# Patient Record
Sex: Male | Born: 1995 | Race: Black or African American | Hispanic: No | Marital: Single | Smoking: Current some day smoker
Health system: Southern US, Community
[De-identification: ages and names within clinical notes are randomized; demographics above are authoritative.]

## PROBLEM LIST (undated history)

## (undated) HISTORY — PX: WISDOM TOOTH EXTRACTION: SHX21

---
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
## 2014-05-30 ENCOUNTER — Emergency Department: Payer: Self-pay | Admitting: Internal Medicine

## 2015-12-27 ENCOUNTER — Encounter (HOSPITAL_COMMUNITY): Payer: Self-pay | Admitting: Emergency Medicine

## 2015-12-27 ENCOUNTER — Emergency Department (HOSPITAL_COMMUNITY)
Admission: EM | Admit: 2015-12-27 | Discharge: 2015-12-27 | Disposition: A | Discharge status: Home / Self Care | Payer: Self-pay | Attending: Emergency Medicine | Admitting: Emergency Medicine

## 2015-12-27 DIAGNOSIS — Z202 Contact with and (suspected) exposure to infections with a predominantly sexual mode of transmission: Secondary | ICD-10-CM

## 2015-12-27 DIAGNOSIS — R300 Dysuria: Secondary | ICD-10-CM | POA: Insufficient documentation

## 2015-12-27 DIAGNOSIS — R369 Urethral discharge, unspecified: Secondary | ICD-10-CM

## 2015-12-27 LAB — URINE MICROSCOPIC-ADD ON
RBC / HPF: NONE SEEN RBC/hpf (ref 0–5)
Squamous Epithelial / HPF: NONE SEEN

## 2015-12-27 LAB — URINALYSIS, ROUTINE W REFLEX MICROSCOPIC
Bilirubin Urine: NEGATIVE
Glucose, UA: NEGATIVE mg/dL
Hgb urine dipstick: NEGATIVE
Ketones, ur: NEGATIVE mg/dL
Nitrite: NEGATIVE
Protein, ur: NEGATIVE mg/dL
Specific Gravity, Urine: 1.025 (ref 1.005–1.030)
pH: 6.5 (ref 5.0–8.0)

## 2015-12-27 MED ORDER — AZITHROMYCIN 250 MG PO TABS
1000.0000 mg | ORAL_TABLET | Freq: Once | ORAL | Status: AC
  Administered 2015-12-27: 1000 mg via ORAL
  Filled 2015-12-27: qty 4

## 2015-12-27 MED ORDER — STERILE WATER FOR INJECTION IJ SOLN
INTRAMUSCULAR | Status: AC
  Administered 2015-12-27: 0.9 mL
  Filled 2015-12-27: qty 10

## 2015-12-27 MED ORDER — CEFTRIAXONE SODIUM 250 MG IJ SOLR
250.0000 mg | Freq: Once | INTRAMUSCULAR | Status: AC
  Administered 2015-12-27: 250 mg via INTRAMUSCULAR
  Filled 2015-12-27: qty 250

## 2015-12-27 NOTE — ED Notes (Signed)
Pt requests STD check and sts penile discharge and burning with urination

## 2015-12-27 NOTE — ED Provider Notes (Signed)
CSN: [id number]     Arrival date & time 12/27/15  1330 History  By signing my name below, I, Ronney LionSuzanne Le, attest that this documentation has been prepared under the direction and in the presence of Ingram Onnen, PA-C. Electronically Signed: Ronney LionSuzanne Le, ED Scribe. 12/27/2015. 3:55 PM.    Chief Complaint  Patient presents with  . Exposure to STD  . Penile Discharge   The history is provided by the patient. No language interpreter was used.   HPI Comments: Vanessa RalphsMonte Shipp is a 20 y.o. male who presents to the Emergency Department complaining of constant, moderate, white penile discharge and burning dysuria that began 1-2 weeks ago. Patient states that at the time, he was sexually active with multiple partners. He reports a history of STD's in the past. No prior treatments or modifying factors were noted. Patient denies abdominal pain, back pain, testicular pain, fever, chills, vomiting, penile itching, or rashes.   History reviewed. No pertinent past medical history. History reviewed. No pertinent past surgical history. History reviewed. No pertinent family history. Social History  Substance Use Topics  . Smoking status: Never Smoker   . Smokeless tobacco: None  . Alcohol Use: Yes    Review of Systems  Constitutional: Negative for fever and chills.  Gastrointestinal: Negative for vomiting and abdominal pain.  Genitourinary: Positive for dysuria and discharge. Negative for testicular pain.  Musculoskeletal: Negative for back pain.  Skin: Negative for rash.   Allergies  Review of patient's allergies indicates no known allergies.  Home Medications   Prior to Admission medications   Not on File   BP 115/73 mmHg  Pulse 59  Temp(Src) 97.9 F (36.6 C) (Oral)  Resp 18  SpO2 97% Physical Exam  Constitutional: He is oriented to person, place, and time. He appears well-developed and well-nourished. No distress.  HENT:  Head: Normocephalic and atraumatic.  Eyes: Conjunctivae are normal.  Right eye exhibits no discharge. Left eye exhibits no discharge. No scleral icterus.  Cardiovascular: Normal rate.   Pulmonary/Chest: Effort normal.  Genitourinary:  White penile discharge present. No penile or testicular tenderness. No rashes noted.   Neurological: He is alert and oriented to person, place, and time. Coordination normal.  Skin: Skin is warm and dry. No rash noted. He is not diaphoretic. No erythema. No pallor.  Psychiatric: He has a normal mood and affect. His behavior is normal.  Nursing note and vitals reviewed.   ED Course  Procedures (including critical care time)  DIAGNOSTIC STUDIES: Oxygen Saturation is 97% on RA, normal by my interpretation.    COORDINATION OF CARE: 3:07 PM - Discussed treatment plan with pt at bedside which includes STD testing and prophylactic treatment. Pt verbalized understanding and agreed to plan.   MDM   Final diagnoses:  Penile discharge  STD exposure   Patient treated in the ED for STI prophylactically with Rocephin and azithromycin. Patient advised to inform and treat all sexual partners.  Pt advised on safe sex practices and understands that they have GC/Chlamydia cultures pending and will result in 2-3 days. HIV and RPR sent. Advised abstention for 1-2 weeks and discussion of any positive STD test results with all partners. Pt encouraged to follow up at local health department for future STI checks. No concern for prostatitis or epididymitis. Discussed return precautions. Pt appears safe for discharge.   I personally performed the services described in this documentation, which was scribed in my presence. The recorded information has been reviewed and is accurate.  Lester Kinsman [place], PA-C 12/29/15 1024  Richardean Canal, MD 12/29/15 [phone]

## 2015-12-27 NOTE — Discharge Instructions (Signed)
Sexually Transmitted Disease °A sexually transmitted disease (STD) is a disease or infection that may be passed (transmitted) from person to person, usually during sexual activity. This may happen by way of saliva, semen, blood, vaginal mucus, or urine. Common STDs include: °· Gonorrhea. °· Chlamydia. °· Syphilis. °· HIV and AIDS. °· Genital herpes. °· Hepatitis B and C. °· Trichomonas. °· Human papillomavirus (HPV). °· Pubic lice. °· Scabies. °· Mites. °· Bacterial vaginosis. °WHAT ARE CAUSES OF STDs? °An STD may be caused by bacteria, a virus, or parasites. STDs are often transmitted during sexual activity if one person is infected. However, they may also be transmitted through nonsexual means. STDs may be transmitted after:  °· Sexual intercourse with an infected person. °· Sharing sex toys with an infected person. °· Sharing needles with an infected person or using unclean piercing or tattoo needles. °· Having intimate contact with the genitals, mouth, or rectal areas of an infected person. °· Exposure to infected fluids during birth. °WHAT ARE THE SIGNS AND SYMPTOMS OF STDs? °Different STDs have different symptoms. Some people may not have any symptoms. If symptoms are present, they may include: °· Painful or bloody urination. °· Pain in the pelvis, abdomen, vagina, anus, throat, or eyes. °· A skin rash, itching, or irritation. °· Growths, ulcerations, blisters, or sores in the genital and anal areas. °· Abnormal vaginal discharge with or without bad odor. °· Penile discharge in men. °· Fever. °· Pain or bleeding during sexual intercourse. °· Swollen glands in the groin area. °· Yellow skin and eyes (jaundice). This is seen with hepatitis. °· Swollen testicles. °· Infertility. °· Sores and blisters in the mouth. °HOW ARE STDs DIAGNOSED? °To make a diagnosis, your health care provider may: °· Take a medical history. °· Perform a physical exam. °· Take a sample of any discharge to examine. °· Swab the throat,  cervix, opening to the penis, rectum, or vagina for testing. °· Test a sample of your first morning urine. °· Perform blood tests. °· Perform a Pap test, if this applies. °· Perform a colposcopy. °· Perform a laparoscopy. °HOW ARE STDs TREATED? °Treatment depends on the STD. Some STDs may be treated but not cured. °· Chlamydia, gonorrhea, trichomonas, and syphilis can be cured with antibiotic medicine. °· Genital herpes, hepatitis, and HIV can be treated, but not cured, with prescribed medicines. The medicines lessen symptoms. °· Genital warts from HPV can be treated with medicine or by freezing, burning (electrocautery), or surgery. Warts may come back. °· HPV cannot be cured with medicine or surgery. However, abnormal areas may be removed from the cervix, vagina, or vulva. °· If your diagnosis is confirmed, your recent sexual partners need treatment. This is true even if they are symptom-free or have a negative culture or evaluation. They should not have sex until their health care providers say it is okay. °· Your health care provider may test you for infection again 3 months after treatment. °HOW CAN I REDUCE MY RISK OF GETTING AN STD? °Take these steps to reduce your risk of getting an STD: °· Use latex condoms, dental dams, and water-soluble lubricants during sexual activity. Do not use petroleum jelly or oils. °· Avoid having multiple sex partners. °· Do not have sex with someone who has other sex partners °· Do not have sex with anyone you do not know or who is at high risk for an STD. °· Avoid risky sex practices that can break your skin. °· Do not have sex   if you have open sores on your mouth or skin.  Avoid drinking too much alcohol or taking illegal drugs. Alcohol and drugs can affect your judgment and put you in a vulnerable position.  Avoid engaging in oral and anal sex acts.  Get vaccinated for HPV and hepatitis. If you have not received these vaccines in the past, talk to your health care  provider about whether one or both might be right for you.  If you are at risk of being infected with HIV, it is recommended that you take a prescription medicine daily to prevent HIV infection. This is called pre-exposure prophylaxis (PrEP). You are considered at risk if:  You are a man who has sex with other men (MSM).  You are a heterosexual man or woman and are sexually active with more than one partner.  You take drugs by injection.  You are sexually active with a partner who has HIV.  Talk with your health care provider about whether you are at high risk of being infected with HIV. If you choose to begin PrEP, you should first be tested for HIV. You should then be tested every 3 months for as long as you are taking PrEP. WHAT SHOULD I DO IF I THINK I HAVE AN STD?  See your health care provider.  Tell your sexual partner(s). They should be tested and treated for any STDs.  Do not have sex until your health care provider says it is okay. WHEN SHOULD I GET IMMEDIATE MEDICAL CARE? Contact your health care provider right away if:   You have severe abdominal pain.  You are a man and notice swelling or pain in your testicles.  You are a woman and notice swelling or pain in your vagina.   This information is not intended to replace advice given to you by your health care provider. Make sure you discuss any questions you have with your health care provider.   [facility] for symptoms don't improve. You'll be notified if your urine is infected. Avoid sexual intercourse until your gonorrhea and chlamydia cultures return. If your cultures come back positive you'll need to notify all sexual partners that they may be informed and treated as well. Return to the emergency department if you experience severe worsening of your symptoms, abdominal pain, blood in your urine, fever, chills.

## 2015-12-28 LAB — GC/CHLAMYDIA PROBE AMP (~~LOC~~) NOT AT ARMC
Chlamydia: NEGATIVE
Neisseria Gonorrhea: POSITIVE — AB

## 2015-12-28 LAB — HIV ANTIBODY (ROUTINE TESTING W REFLEX): HIV Screen 4th Generation wRfx: NONREACTIVE

## 2015-12-28 LAB — SYPHILIS: RPR W/REFLEX TO RPR TITER AND TREPONEMAL ANTIBODIES, TRADITIONAL SCREENING AND DIAGNOSIS ALGORITHM: RPR Ser Ql: NONREACTIVE

## 2015-12-29 ENCOUNTER — Telehealth (HOSPITAL_BASED_OUTPATIENT_CLINIC_OR_DEPARTMENT_OTHER): Payer: Self-pay | Admitting: Emergency Medicine

## 2015-12-29 LAB — URINE CULTURE
Culture: NO GROWTH
Special Requests: NORMAL

## 2016-01-19 ENCOUNTER — Telehealth: Payer: Self-pay | Admitting: Emergency Medicine

## 2016-01-19 NOTE — Telephone Encounter (Signed)
Patient lost to followup, no response from letter 

## 2017-06-14 ENCOUNTER — Emergency Department (HOSPITAL_COMMUNITY): Payer: Worker's Compensation

## 2017-06-14 ENCOUNTER — Emergency Department (HOSPITAL_COMMUNITY)
Admission: EM | Admit: 2017-06-14 | Discharge: 2017-06-14 | Disposition: A | Discharge status: Home / Self Care | Payer: Worker's Compensation | Attending: Emergency Medicine | Admitting: Emergency Medicine

## 2017-06-14 ENCOUNTER — Encounter (HOSPITAL_COMMUNITY): Payer: Self-pay | Admitting: Emergency Medicine

## 2017-06-14 DIAGNOSIS — Y929 Unspecified place or not applicable: Secondary | ICD-10-CM | POA: Insufficient documentation

## 2017-06-14 DIAGNOSIS — Y939 Activity, unspecified: Secondary | ICD-10-CM | POA: Insufficient documentation

## 2017-06-14 DIAGNOSIS — S99921A Unspecified injury of right foot, initial encounter: Secondary | ICD-10-CM | POA: Diagnosis not present

## 2017-06-14 DIAGNOSIS — W208XXA Other cause of strike by thrown, projected or falling object, initial encounter: Secondary | ICD-10-CM | POA: Insufficient documentation

## 2017-06-14 DIAGNOSIS — F17290 Nicotine dependence, other tobacco product, uncomplicated: Secondary | ICD-10-CM | POA: Diagnosis not present

## 2017-06-14 DIAGNOSIS — Y990 Civilian activity done for income or pay: Secondary | ICD-10-CM | POA: Insufficient documentation

## 2017-06-14 MED ORDER — IBUPROFEN 600 MG PO TABS: 600.0000 mg | ORAL_TABLET | Freq: Four times a day (QID) | ORAL | 0 refills | Status: AC | PRN

## 2017-06-14 NOTE — ED Provider Notes (Signed)
[facility]-EMERGENCY DEPT Provider Note   CSN: [id number] Arrival date & time: 06/14/17  1023     History   Chief Complaint Chief Complaint  Patient presents with  . Foot Injury    HPI Antonio Austin is a 21 y.o. male.  HPI   Patient is a 21 year old male with no significant past medical history presenting after an injury on 06-13-2017 where he dropped a board on his right foot at work. Patient reports that he was weight bearing after the injury and finished at the remainder of the day at work. Patient reports some ecchymosis and swelling over the dorsum of the foot, as well as some paresthesias, however denies anesthesia of the right foot, pallor, increasing pain. Patient denies any precipitator to this event such as dizziness, lightheadedness, or presyncope. Patient has not tried anything for symptoms.  History reviewed. No pertinent past medical history.  There are no active problems to display for this patient.   Past Surgical History:  Procedure Laterality Date  . WISDOM TOOTH EXTRACTION         Home Medications    Prior to Admission medications   Not on File    Family History No family history on file.  Social History Social History  Substance Use Topics  . Smoking status: Current Every Day Smoker    Types: Cigars  . Smokeless tobacco: Never Used  . Alcohol use Yes     Allergies   Bee venom   Review of Systems Review of Systems  Musculoskeletal: Positive for joint swelling.  Skin: Positive for color change.  Neurological: Negative for dizziness, light-headedness and numbness.     Physical Exam Updated Vital Signs BP 106/65 (BP Location: Left Arm)   Pulse 68   Temp 97.9 F (36.6 C) (Oral)   Resp 16   SpO2 99%   Physical Exam  Constitutional: He appears well-developed and well-nourished. No distress.  Sitting comfortably in bed.  HENT:  Head: Normocephalic and atraumatic.  Eyes: Conjunctivae are normal. Right eye exhibits no discharge. Left eye  exhibits no discharge.  EOMs normal to gross examination.  Neck: Normal range of motion.  Cardiovascular: Normal rate and regular rhythm.   Pulses:      Dorsalis pedis pulses are 2+ on the right side.  Intact, 2+ radial pulse.  Pulmonary/Chest:  Normal respiratory effort. Patient converses comfortably. No audible wheeze or stridor.  Abdominal: He exhibits no distension.  Musculoskeletal: Normal range of motion.  Right foot demonstrates erythema and ecchymosis over the dorsum of the foot. Tenderness over dorsum of foot. No ligamentous instability of the ankle mortise or forefoot. Patient presents active flexion, extension, inversion, and eversion of right foot.  Neurological: He is alert.  Cranial nerves intact to gross observation. Patient moves extremities without difficulty.  Skin: Skin is warm and dry. He is not diaphoretic.  Psychiatric: He has a normal mood and affect. His behavior is normal. Judgment and thought content normal.  Nursing note and vitals reviewed.    ED Treatments / Results  Labs (all labs ordered are listed, but only abnormal results are displayed) Labs Reviewed - No data to display  EKG  EKG Interpretation None       Radiology Dg Foot Complete Right  Result Date: 06/14/2017 CLINICAL DATA:  Dropped a board onto the dorsal surface of the RIGHT foot yesterday at work, still having pain across the metatarsals EXAM: RIGHT FOOT COMPLETE - 3+ VIEW COMPARISON:  None FINDINGS: Osseous mineralization normal. Joint spaces preserved.  No acute fracture, dislocation, or bone destruction. IMPRESSION: Normal exam. Electronically Signed   By: Ulyses Southward M.D.   On: 06/14/2017 11:17    Procedures Procedures (including critical care time)  Medications Ordered in ED Medications - No data to display   Initial Impression / Assessment and Plan / ED Course  I have reviewed the triage vital signs and the nursing notes.  Pertinent labs & imaging results that were  available during my care of the patient were reviewed by me and considered in my medical decision making (see chart for details).     Final Clinical Impressions(s) / ED Diagnoses   Final diagnoses:  None   MDM  Antonio Austin is a 21 y.o. male who presents to ED for Right foot pain after dropping a wooden board on his foot at work yesterday. RLE NVI. Exam c/w foot contusion. X-ray negative for acute injury. Postop shoe and crutches provided in ED. Home care instructions including RICE and NSAID's discussed. Follow up with  PCP and ortho if symptoms not improving in 1 week. All questions answered.     New Prescriptions New Prescriptions   No medications on file     Delia Chimes 06/14/17 1221    Derwood Kaplan, MD 06/15/17 1517

## 2017-06-14 NOTE — Discharge Instructions (Signed)
Please see the information and instructions below regarding your visit.  Your diagnoses today include:  1. Injury of right foot, initial encounter    Your provider has diagnosed you as suffering from a foot sprain. Foot sprain occurs when the ligaments that hold the foot joint together are stretched or torn. It may take a few weeks to heal.  Tests performed today include: An x-ray of your foot- does NOT show any broken bones. Please follow-up if your symptoms are not improved in 1 week however in case there was something we could not see on this visit due to swelling.  See side panel of your discharge paperwork for testing performed today. Vital signs are listed at the bottom of these instructions.   Medications prescribed:  Take any prescribed medications only as prescribed, and any over the counter medications only as directed on the packaging.  Home care instructions:  Follow R.I.C.E. Protocol: R - rest your injury  I  - use ice on injury without applying directly to skin C - compress injury with bandage or splint E - elevate the injury as much as possible  For Activity: Wear ankle brace for at least 2 weeks for stabilization of ankle. If prescribed crutches, use crutches with non-weight bearing for the first few days. Then, you may walk on your ankle as the pain allows, or as instructed. Start gradually with weight bearing on the affected ankle. Once you can walk pain free, then try jogging. When you can run forwards, then you can try moving side-to-side. If you cannot walk without crutches in one week, you need a re-check.  Please follow any educational materials contained in this packet.   Follow-up instructions: Please follow-up with your primary care provider or the provided orthopedic (bone specialist) listed in this packet if you continue to have significant pain or trouble walking in 1 week. In this case you may have a severe sprain that requires further care.   Return  instructions:  Please return if your toes are numb or tingling, appear gray or blue, are much colder than your other foot, or you have severe pain (also elevate leg and loosen splint or wrap). Please return to the Emergency Department if you experience worsening symptoms.  Please return if you have any other emergent concerns.  Additional Information:   Your vital signs today were: BP 106/65 (BP Location: Left Arm)    Pulse 68    Temp 97.9 F (36.6 C) (Oral)    Resp 16    SpO2 99%  If your blood pressure (BP) was elevated on multiple readings during this visit above 130 for the top number or above 80 for the bottom number, please have this repeated by your primary care provider within one month. --------------  Thank you for allowing [place] to participate in your care today.

## 2017-06-14 NOTE — ED Triage Notes (Signed)
Patient reports yesterday at work dropped board on right foot. Patient c/o pain worsens with movement.

## 2017-06-18 ENCOUNTER — Emergency Department (HOSPITAL_BASED_OUTPATIENT_CLINIC_OR_DEPARTMENT_OTHER)
Admission: EM | Admit: 2017-06-18 | Discharge: 2017-06-18 | Disposition: A | Discharge status: Home / Self Care | Payer: Self-pay | Attending: Emergency Medicine | Admitting: Emergency Medicine

## 2017-06-18 ENCOUNTER — Encounter (HOSPITAL_BASED_OUTPATIENT_CLINIC_OR_DEPARTMENT_OTHER): Payer: Self-pay | Admitting: Emergency Medicine

## 2017-06-18 DIAGNOSIS — R369 Urethral discharge, unspecified: Secondary | ICD-10-CM | POA: Insufficient documentation

## 2017-06-18 DIAGNOSIS — F17290 Nicotine dependence, other tobacco product, uncomplicated: Secondary | ICD-10-CM | POA: Insufficient documentation

## 2017-06-18 DIAGNOSIS — Z202 Contact with and (suspected) exposure to infections with a predominantly sexual mode of transmission: Secondary | ICD-10-CM | POA: Insufficient documentation

## 2017-06-18 LAB — URINALYSIS, ROUTINE W REFLEX MICROSCOPIC
BILIRUBIN URINE: NEGATIVE
GLUCOSE, UA: NEGATIVE mg/dL
HGB URINE DIPSTICK: NEGATIVE
KETONES UR: NEGATIVE mg/dL
LEUKOCYTES UA: NEGATIVE
Nitrite: NEGATIVE
PH: 7.5 (ref 5.0–8.0)
Protein, ur: NEGATIVE mg/dL
Specific Gravity, Urine: 1.015 (ref 1.005–1.030)

## 2017-06-18 MED ORDER — AZITHROMYCIN 250 MG PO TABS
1000.0000 mg | ORAL_TABLET | Freq: Once | ORAL | Status: AC
  Administered 2017-06-18: 1000 mg via ORAL
  Filled 2017-06-18: qty 4

## 2017-06-18 MED ORDER — CEFTRIAXONE SODIUM 250 MG IJ SOLR
250.0000 mg | Freq: Once | INTRAMUSCULAR | Status: AC
  Administered 2017-06-18: 250 mg via INTRAMUSCULAR
  Filled 2017-06-18: qty 250
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
## 2017-06-18 NOTE — Discharge Instructions (Signed)
You have been seen today in the Emergency Department (ED) for urethral discharge.  Your workup today shows that you may have been exposed to sexually transmitted infections (STIs).  You have been treated with a one time dose medication for both of these conditions.  Please have your partner tested for STD's and do not resume sexual activity until you and your partner have confirmed negative test results or have both been treated.  Please follow up with your doctor as soon as possible regarding today?s ED visit and your symptoms.   Return to the ED if your pain worsens, you develop a fever, or for any other symptoms that concern you.

## 2017-06-18 NOTE — ED Provider Notes (Signed)
Emergency Department Provider Note   I have reviewed the triage vital signs and the nursing notes.   HISTORY  Chief Complaint Exposure to STD   HPI Antonio Austin is a 21 y.o. male presents to the emergency room in for evaluation of possible STD exposure. The patient states that he had some urethral discharge yesterday which has since resolved. Denies any hematuria. He also noticed some blood in the discharge. No scrotal or testicular pain. No lower abdominal or back discomfort. He is sexually active and does not use condoms regularly. States he is in the [employer] and is tested regularly for HIV and syphilis. No other complaints at this time. No radiation symptoms. No modifying factors.   History reviewed. No pertinent past medical history.  There are no active problems to display for this patient.   Past Surgical History:  Procedure Laterality Date  . WISDOM TOOTH EXTRACTION      Current Outpatient Rx  . Order #: [id number] Class: Print    Allergies Bee venom  History reviewed. No pertinent family history.  Social History Social History  Substance Use Topics  . Smoking status: Current Every Day Smoker    Types: Cigars  . Smokeless tobacco: Never Used  . Alcohol use Yes    Review of Systems  Constitutional: No fever/chills Eyes: No visual changes. ENT: No sore throat. Cardiovascular: Denies chest pain. Respiratory: Denies shortness of breath. Gastrointestinal: No abdominal pain.  No nausea, no vomiting.  No diarrhea.  No constipation. Genitourinary: Negative for dysuria. Some urethral discharge yesterday.  Musculoskeletal: Negative for back pain. Skin: Negative for rash. Neurological: Negative for headaches, focal weakness or numbness.  10-point ROS otherwise negative.  ____________________________________________   PHYSICAL EXAM:  VITAL SIGNS: ED Triage Vitals  Enc Vitals Group     BP 06/18/17 1516 120/82     Pulse Rate 06/18/17 1516 65     Resp  06/18/17 1516 18     Temp 06/18/17 1516 98.5 F (36.9 C)     Temp Source 06/18/17 1516 Oral     SpO2 06/18/17 1516 100 %     Weight 06/18/17 1517 135 lb (61.2 kg)     Height 06/18/17 1517  (1.676 m)     Pain Score 06/18/17 1516 0   Constitutional: Alert and oriented. Well appearing and in no acute distress. Eyes: Conjunctivae are normal.  Head: Atraumatic. Nose: No congestion/rhinnorhea. Mouth/Throat: Mucous membranes are moist.   Neck: No stridor.   Cardiovascular: Normal rate, regular rhythm. Good peripheral circulation. Grossly normal heart sounds.   Respiratory: Normal respiratory effort.  No retractions. Lungs CTAB. Gastrointestinal: Soft and nontender. No distention.  Genitourinary: Normal exam. No discharge.  Musculoskeletal: No lower extremity tenderness nor edema. No gross deformities of extremities. Neurologic:  Normal speech and language. No gross focal neurologic deficits are appreciated.  Skin:  Skin is warm, dry and intact. No rash noted.  ____________________________________________   LABS (all labs ordered are listed, but only abnormal results are displayed)  Labs Reviewed  URINALYSIS, ROUTINE W REFLEX MICROSCOPIC  GC/CHLAMYDIA PROBE AMP ([place]) NOT AT [facility]   ____________________________________________  RADIOLOGY  None ____________________________________________   PROCEDURES  Procedure(s) performed:   Procedures  None ____________________________________________   INITIAL IMPRESSION / ASSESSMENT AND PLAN / ED COURSE  Pertinent labs & imaging results that were available during my care of the patient were reviewed by me and considered in my medical decision making (see chart for details).  No urethral discharge on exam. Normal  external male genitalia. Plan to treat empirically for gonorrhea and chlamydia. Will not perform HIV and syphilis testing as the patient states he gets his regularly.  UA negative. Patient treated as above.  Discussed treating partners and using barrier protection for sex.   At this time, I do not feel there is any life-threatening condition present. I have reviewed and discussed all results (EKG, imaging, lab, urine as appropriate), exam findings with patient. I have reviewed nursing notes and appropriate previous records.  I feel the patient is safe to be discharged home without further emergent workup. Discussed usual and customary return precautions. Patient and family (if present) verbalize understanding and are comfortable with this plan.  Patient will follow-up with their primary care provider. If they do not have a primary care provider, information for follow-up has been provided to them. All questions have been answered.  ____________________________________________  FINAL CLINICAL IMPRESSION(S) / ED DIAGNOSES  Final diagnoses:  STD exposure  Penile discharge     MEDICATIONS GIVEN DURING THIS VISIT:  Medications  azithromycin (ZITHROMAX) tablet 1,000 mg (1,000 mg Oral Given 06/18/17 1551)  cefTRIAXone (ROCEPHIN) injection 250 mg (250 mg Intramuscular Given 06/18/17 1552)     NEW OUTPATIENT MEDICATIONS STARTED DURING THIS VISIT:  None   Note:  This document was prepared using Dragon voice recognition software and may include unintentional dictation errors.  Alona Bene, MD Emergency Medicine    Long, Arlyss Repress, MD 06/18/17 [phone]

## 2017-06-19 ENCOUNTER — Emergency Department (HOSPITAL_COMMUNITY)
Admission: EM | Admit: 2017-06-19 | Discharge: 2017-06-19 | Disposition: A | Discharge status: Home / Self Care | Payer: Self-pay | Attending: Emergency Medicine | Admitting: Emergency Medicine

## 2017-06-19 ENCOUNTER — Encounter (HOSPITAL_COMMUNITY): Payer: Self-pay | Admitting: Nurse Practitioner

## 2017-06-19 DIAGNOSIS — W208XXD Other cause of strike by thrown, projected or falling object, subsequent encounter: Secondary | ICD-10-CM | POA: Insufficient documentation

## 2017-06-19 DIAGNOSIS — F17290 Nicotine dependence, other tobacco product, uncomplicated: Secondary | ICD-10-CM | POA: Insufficient documentation

## 2017-06-19 DIAGNOSIS — M79671 Pain in right foot: Secondary | ICD-10-CM | POA: Insufficient documentation

## 2017-06-19 LAB — GC/CHLAMYDIA PROBE AMP (~~LOC~~) NOT AT ARMC
CHLAMYDIA, DNA PROBE: NEGATIVE
Neisseria Gonorrhea: NEGATIVE

## 2017-06-19 MED ORDER — NAPROXEN 500 MG PO TABS: 500.0000 mg | ORAL_TABLET | Freq: Two times a day (BID) | ORAL | 0 refills | Status: AC

## 2017-06-19 NOTE — Discharge Instructions (Signed)
The exam of your right foot was normal today. The x-ray of your right foot was negative for fracture last week.   The pain he was experiencing in her right foot is due to soft tissue injury and bruising. It is normal for this to go on for several weeks. Continue to apply ice to the top of the right foot, I have written a prescription for a medicine called Naprosyn which she can also take for pain.  I have continued the information to schedule an appointment with [facility]. They're located across from [facility] and accept patients without insurance. Please schedule an appointment establish care and also for your ongoing right foot pain.  Please return to the emergency department if you experience right foot pain and which she was unable to walk or if you have any new or worsening symptoms.

## 2017-06-19 NOTE — ED Provider Notes (Signed)
[facility]-EMERGENCY DEPT Provider Note   CSN: [id number] Arrival date & time: 06/19/17  1203     History   Chief Complaint Chief Complaint  Patient presents with  . Foot Pain    Right     HPI Antonio Austin is a 21 y.o. male.  HPI   Antonio Austin Is a 21 year old male with no significant past medical history who presents the emergency department for evaluation of ongoing right foot pain. Patient states that a board fell on top of his right foot while at work last week. He was seen in the emergency department at that time, x-rays were taken and no acute fracture was found. He states that since that time his pain has improved, but that is not totally gone. He reports 3/10, dull pain over the dorsal aspect of the right foot. It is worsened with pressure over the area, and walking. He has been able to ambulate independently without difficulty. Reports that he was told to follow up if his pain had not resolved in 7 days which is why he is here today. Denies numbness, weakness, fever, erythema, warmth, joint pain elsewhere.  History reviewed. No pertinent past medical history.  There are no active problems to display for this patient.   Past Surgical History:  Procedure Laterality Date  . WISDOM TOOTH EXTRACTION         Home Medications    Prior to Admission medications   Medication Sig Start Date End Date Taking? Authorizing Provider  ibuprofen (ADVIL,MOTRIN) 600 MG tablet Take 1 tablet (600 mg total) by mouth every 6 (six) hours as needed. 06/14/17  Yes [place] Scrape, Alyssa B, PA-C  naproxen (NAPROSYN) 500 MG tablet Take 1 tablet (500 mg total) by mouth 2 (two) times daily. 06/19/17   Kellie Shropshire, PA-C    Family History No family history on file.  Social History Social History  Substance Use Topics  . Smoking status: Current Every Day Smoker    Types: Cigars  . Smokeless tobacco: Never Used  . Alcohol use Yes     Allergies   Bee venom   Review of Systems Review of Systems    Constitutional: Negative for fever.  Musculoskeletal: Positive for arthralgias (right foot). Negative for gait problem and joint swelling.  Skin: Negative for rash and wound.  Neurological: Negative for weakness and numbness.     Physical Exam Updated Vital Signs BP 106/68 (BP Location: Left Arm)   Pulse 68   Temp 98.6 F (37 C) (Oral)   Resp 20   Ht  (1.676 m)   Wt 61.2 kg (135 lb)   SpO2 100%   BMI 21.79 kg/m   Physical Exam  Constitutional: He appears well-developed and well-nourished. No distress.  HENT:  Head: Normocephalic and atraumatic.  Eyes: Right eye exhibits no discharge. Left eye exhibits no discharge.  Pulmonary/Chest: Effort normal. No respiratory distress.  Musculoskeletal:  Mild tenderness to palpation over the medial dorsal aspect of the right foot. Full active and passive range of motion of the toes, ankles, knees bilaterally. 5/5 strength in ankles, knees, hips bilaterally. DP and PT 2+ bilaterally  Neurological: He is alert. Coordination normal.  Distal sensation to light touch intact in bilateral lower extremities. Gait normal, good balance.  Skin: Skin is warm and dry. Capillary refill takes less than 2 seconds. He is not diaphoretic.  No swelling, erythema, warmth over the skin of the right foot.  Psychiatric: He has a normal mood and affect. His behavior  is normal.  Nursing note and vitals reviewed.    ED Treatments / Results  Labs (all labs ordered are listed, but only abnormal results are displayed) Labs Reviewed - No data to display  EKG  EKG Interpretation None       Radiology No results found.  Procedures Procedures (including critical care time)  Medications Ordered in ED Medications - No data to display   Initial Impression / Assessment and Plan / ED Course  I have reviewed the triage vital signs and the nursing notes.  Pertinent labs & imaging results that were available during my care of the patient were reviewed  by me and considered in my medical decision making (see chart for details).     Patient with improving right foot pain after having a board fall on the top of the foot last week. X-rays of right foot negative last week, no new injury. Will not get a new x-ray today. Have counseled patient that it sometimes takes several weeks for soft tissue injuries to improve in the foot. That is reassuring that his pain has been improving. Given him instructions to continue taking NSAIDs, to use ice, and rest the foot. Have discussed return precautions. Have also given patient information for [facility] in order to establish care and for follow-up of his ongoing right foot pain. Patient agrees and moist understanding to the above plan.  Final Clinical Impressions(s) / ED Diagnoses   Final diagnoses:  Right foot pain    New Prescriptions New Prescriptions   NAPROXEN (NAPROSYN) 500 MG TABLET    Take 1 tablet (500 mg total) by mouth 2 (two) times daily.     Kellie Shropshire, PA-C 06/19/17 1413    Pricilla Loveless, MD 06/20/17 [phone]

## 2017-06-19 NOTE — ED Triage Notes (Signed)
Patient c/o right foot pain. Reports being seen about a week ago and told he had a sprain from dropping ball on foot. Patient reports swelling resolved however the pain is still there. Reports receiving RX for ibuprofen but never picking it up. Patient is A&O x4 ambulatory without appearing to have difficulty. States he was told if pain continues over the next 7-10 days he should return. Denies having a PCP for follow up. Denies any swelling, recurrent injury, or trauma to foot.

## 2017-09-23 ENCOUNTER — Encounter (HOSPITAL_COMMUNITY): Payer: Self-pay | Admitting: Emergency Medicine

## 2017-09-23 ENCOUNTER — Emergency Department (HOSPITAL_COMMUNITY)
Admission: EM | Admit: 2017-09-23 | Discharge: 2017-09-23 | Disposition: A | Discharge status: Home / Self Care | Payer: Self-pay | Attending: Emergency Medicine | Admitting: Emergency Medicine

## 2017-09-23 ENCOUNTER — Other Ambulatory Visit: Payer: Self-pay

## 2017-09-23 DIAGNOSIS — F17210 Nicotine dependence, cigarettes, uncomplicated: Secondary | ICD-10-CM | POA: Insufficient documentation

## 2017-09-23 DIAGNOSIS — H6121 Impacted cerumen, right ear: Secondary | ICD-10-CM

## 2017-09-23 DIAGNOSIS — Z711 Person with feared health complaint in whom no diagnosis is made: Secondary | ICD-10-CM

## 2017-09-23 LAB — I-STAT CHEM 8, ED
BUN: 9 mg/dL (ref 6–20)
CALCIUM ION: 1.17 mmol/L (ref 1.15–1.40)
CHLORIDE: 106 mmol/L (ref 101–111)
CREATININE: 1.1 mg/dL (ref 0.61–1.24)
GLUCOSE: 107 mg/dL — AB (ref 65–99)
HCT: 41 % (ref 39.0–52.0)
Hemoglobin: 13.9 g/dL (ref 13.0–17.0)
Potassium: 3.7 mmol/L (ref 3.5–5.1)
Sodium: 143 mmol/L (ref 135–145)
TCO2: 24 mmol/L (ref 22–32)

## 2017-09-23 MED ORDER — CARBAMIDE PEROXIDE 6.5 % OT SOLN: 5.0000 [drp] | Freq: Two times a day (BID) | OTIC | 0 refills | Status: AC

## 2017-09-23 NOTE — ED Triage Notes (Signed)
Pt states he thinks he may be anemic because his hands and feet feel cold all the time  Pt states this has been off and on for the past week or two  Pt states he gets intermittent cramping and his jaw feels funny  Denies pain

## 2017-09-23 NOTE — Discharge Instructions (Signed)
You have been evaluated for your symptoms.  You do not have any electrolytes imbalance and no evidence of anemia.  Your symptoms may be due to anxiety or panic attack.  Also, you have wax build up in your ears causing ringing sound and decrease hearing.  Please use debrox ear drops as prescribed to help with the wax build up.  Follow up with a primary care provider for further management of your health.

## 2017-09-23 NOTE — ED Provider Notes (Signed)
[facility]-EMERGENCY DEPT Provider Note   CSN: [id number] Arrival date & time: 09/23/17  1952     History   Chief Complaint Chief Complaint  Patient presents with  . feels cold    HPI Antonio Austin is a 22 y.o. male.  HPI   22 year old male c/o cold hands and feet. 2 weeks ago he experienced cold hand/feet. sxs lasted for approximately 1 day and resolved. Sxs started after an anxiety attack.  He also notice tingling sensation to his extremities, nausea.  Pt then report Having trouble hearing out of R ear with ringing sound for the past few days. R jaw swelling.  Hx of anxiety.  Denies headache, fever, sore throat, runny nose.  Admits to increasing stress but no report of SI/HI. Today he experienced the same coldness in his hands and feet and was concerned. Denies drugs or alcohol use.  Does admits to drinking energy drinks on occasion, but no increasing use.  Did tried irrigate ear with hydrogen peroxide recently without relief.  No hx of thyroid disease. No prior hx of autoimmune disease or connective tissue disease.    History reviewed. No pertinent past medical history.  There are no active problems to display for this patient.   Past Surgical History:  Procedure Laterality Date  . WISDOM TOOTH EXTRACTION         Home Medications    Prior to Admission medications   Medication Sig Start Date End Date Taking? Authorizing Provider  ibuprofen (ADVIL,MOTRIN) 600 MG tablet Take 1 tablet (600 mg total) by mouth every 6 (six) hours as needed. 06/14/17   Aviva Kluver B, PA-C  naproxen (NAPROSYN) 500 MG tablet Take 1 tablet (500 mg total) by mouth 2 (two) times daily. 06/19/17   Kellie Shropshire, PA-C    Family History History reviewed. No pertinent family history.  Social History Social History   Tobacco Use  . Smoking status: Current Some Day Smoker    Types: Cigars  . Smokeless tobacco: Never Used  Substance Use Topics  . Alcohol use: Yes   Comment: occ  . Drug use: No     Allergies   Bee venom   Review of Systems Review of Systems  All other systems reviewed and are negative.    Physical Exam Updated Vital Signs BP 123/72 (BP Location: Left Arm)   Pulse 64   Temp 98.2 F (36.8 C) (Oral)   Resp 16   SpO2 100%   Physical Exam  Constitutional: He is oriented to person, place, and time. He appears well-developed and well-nourished. No distress.  HENT:  Head: Atraumatic.  Ears: cerumen impaction to both ears, unable to visualized TMs Nose: normal nares Throat: uvula midline, no tonsilar enlargement or exudates, no trismus  Eyes: Conjunctivae and EOM are normal. Pupils are equal, round, and reactive to light.  Neck: Normal range of motion. Neck supple. No thyromegaly present.  Cardiovascular: Normal rate and regular rhythm.  Pulmonary/Chest: Effort normal and breath sounds normal. No stridor. No respiratory distress. He has no wheezes. He has no rales. He exhibits no tenderness.  Abdominal: Soft. He exhibits no distension. There is no tenderness.  Musculoskeletal: He exhibits no edema or tenderness.  Lymphadenopathy:    He has no cervical adenopathy.  Neurological: He is alert and oriented to person, place, and time.  Skin: Skin is warm. Capillary refill takes less than 2 seconds. No rash noted. No pallor.  Brisk cap refills to all 4 extremities.  Normal skin tone, no evidence of cyanoses.  Radial pulses 2+ bilaterally  Psychiatric: He has a normal mood and affect.  Nursing note and vitals reviewed.    ED Treatments / Results  Labs (all labs ordered are listed, but only abnormal results are displayed) Labs Reviewed  I-STAT CHEM 8, ED - Abnormal; Notable for the following components:      Result Value   Glucose, Bld 107 (*)    All other components within normal limits    EKG  EKG Interpretation None       Radiology No results found.  Procedures Procedures (including critical care  time)  Medications Ordered in ED Medications - No data to display   Initial Impression / Assessment and Plan / ED Course  I have reviewed the triage vital signs and the nursing notes.  Pertinent labs & imaging results that were available during my care of the patient were reviewed by me and considered in my medical decision making (see chart for details).     BP 123/72 (BP Location: Left Arm)   Pulse 64   Temp 98.2 F (36.8 C) (Oral)   Resp 16   SpO2 100%    Final Clinical Impressions(s) / ED Diagnoses   Final diagnoses:  Hearing loss of right ear due to cerumen impaction  Physically well but worried    ED Discharge Orders        Ordered    carbamide peroxide (DEBROX) 6.5 % OTIC solution  2 times daily     09/23/17 2149     9:47 PM Pt here with c/o cold hands and feet when he have bouts of anxiety.  No evidence of vascular compromised on exam.  Labs are reassuring, no anemia or electrolytes imbalance.  Pt is well appearing.  Vital sign stable.  Doubt acute emergent medical condition.  Does have cerumen impaction in ears.  Will prescribe debrox ear drops to help with impaction.  Recommend f/u with pcp for further management of his condition.  Return precaution given.     Fayrene Helperran, Jacquelynn Friend, PA-C 09/23/17 2151    Mancel BaleWentz, Elliott, MD 09/24/17 [phone]

## 2018-04-02 IMAGING — CR DG FOOT COMPLETE 3+V*R*
3 series · 3 of 3 positions shown · non-contrast
Comparison: None

CLINICAL DATA: Dropped a board onto the dorsal surface of the RIGHT
foot yesterday at work, still having pain across the metatarsals

EXAM:
RIGHT FOOT COMPLETE - 3+ VIEW

[x foot ap right]
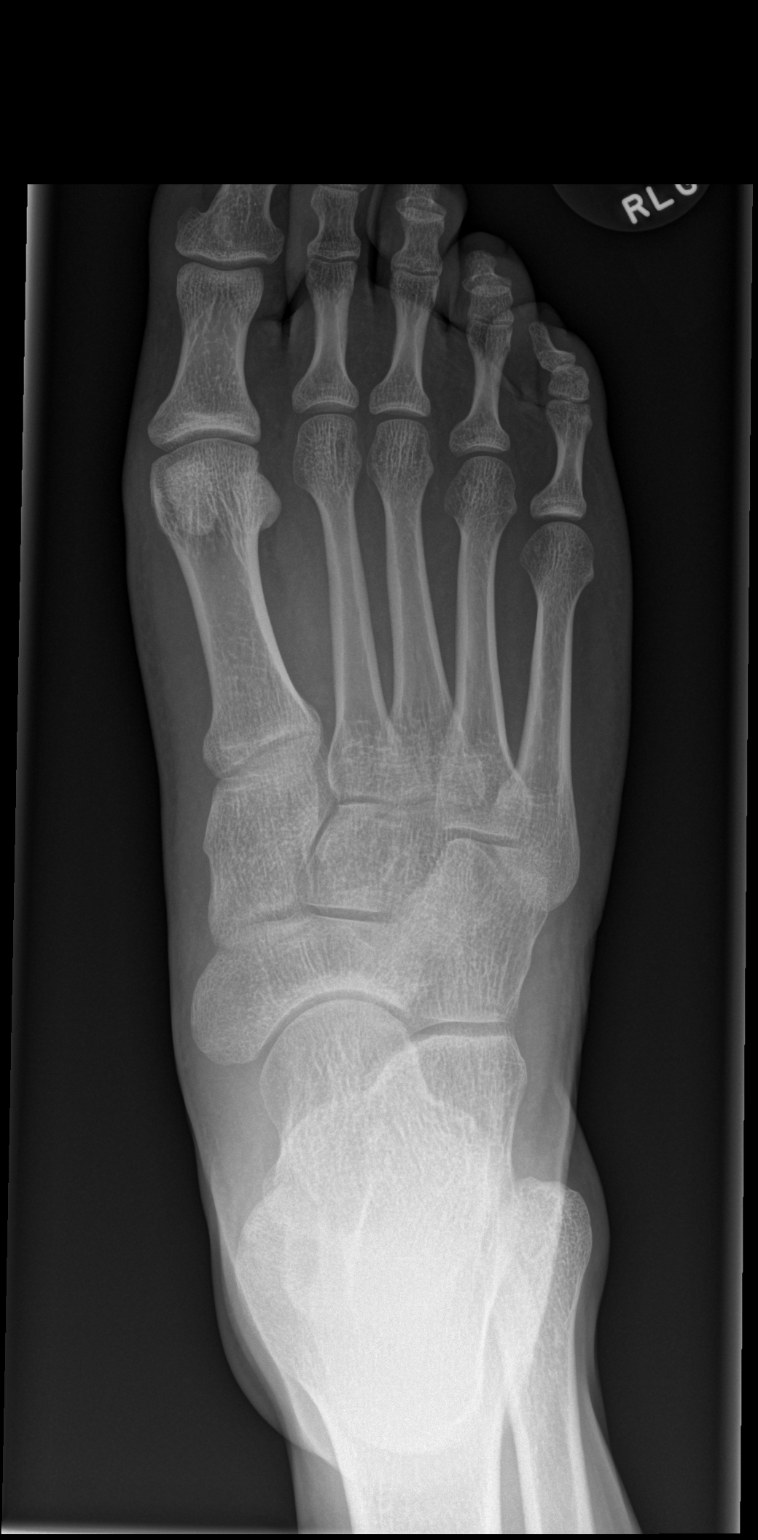

[x foot obl right]
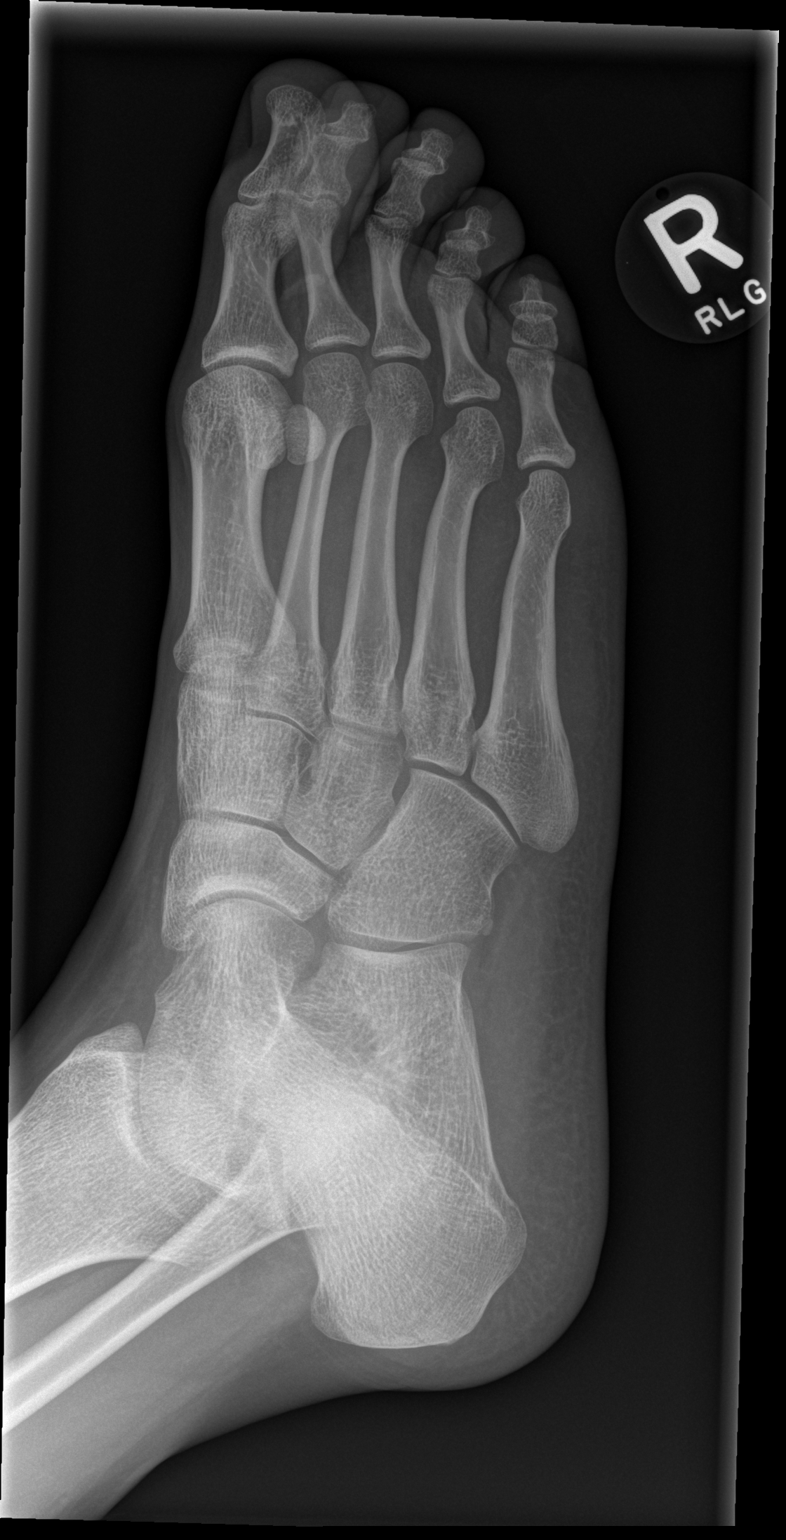

[x foot lat right]
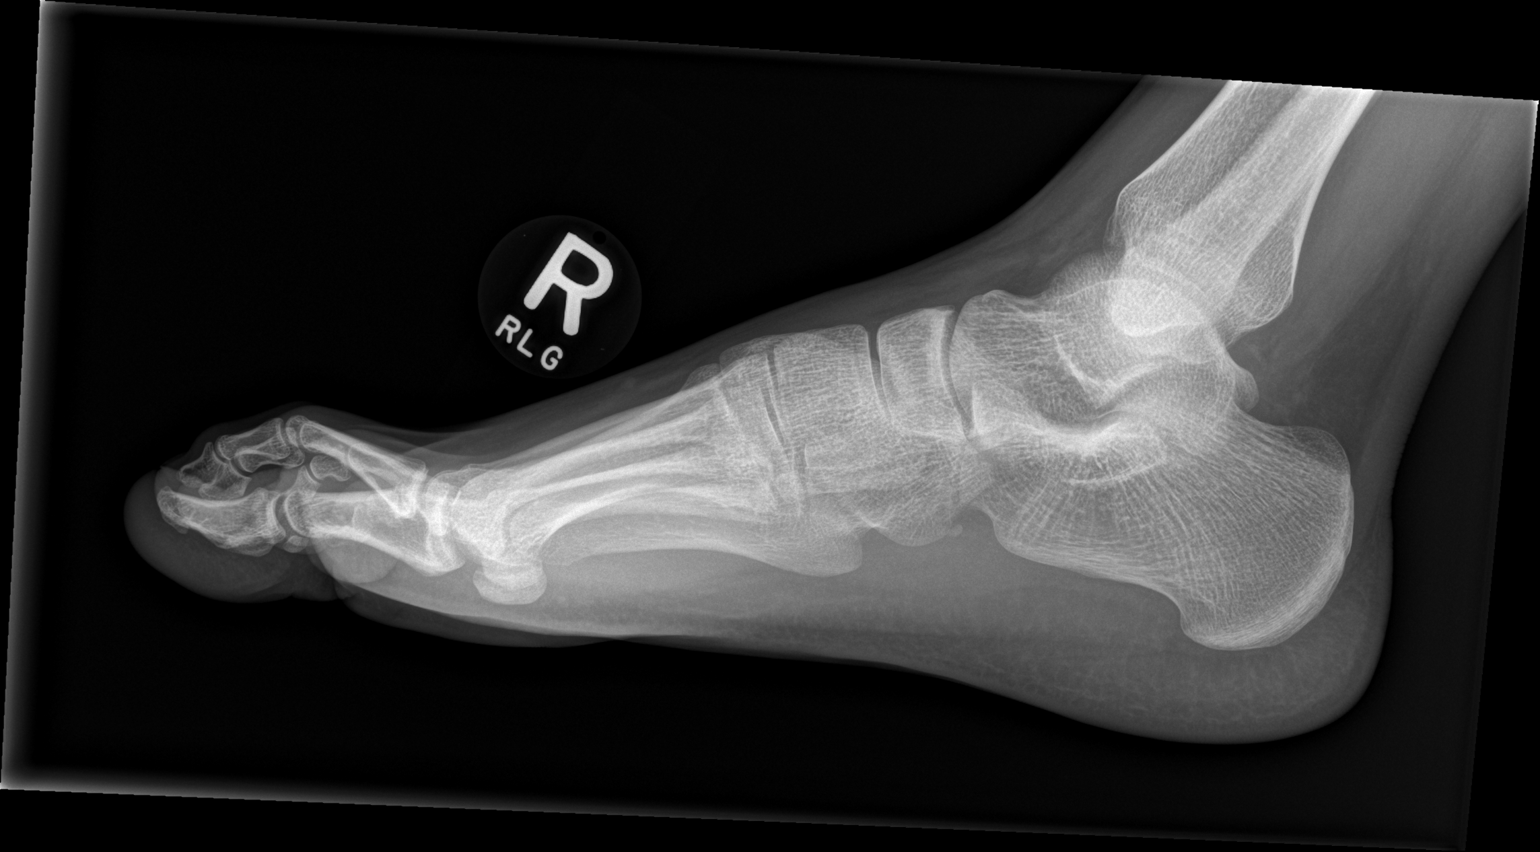

[3 of 3 positions shown; findings below may reference images not displayed]

FINDINGS: Osseous mineralization normal.

Joint spaces preserved.

No acute fracture, dislocation, or bone destruction.
IMPRESSION: Normal exam.

## 2018-08-16 ENCOUNTER — Emergency Department (HOSPITAL_COMMUNITY)
Admission: EM | Admit: 2018-08-16 | Discharge: 2018-08-16 | Disposition: A | Discharge status: Home / Self Care | Payer: Self-pay | Attending: Emergency Medicine | Admitting: Emergency Medicine

## 2018-08-16 ENCOUNTER — Encounter (HOSPITAL_COMMUNITY): Payer: Self-pay | Admitting: *Deleted

## 2018-08-16 ENCOUNTER — Other Ambulatory Visit: Payer: Self-pay

## 2018-08-16 DIAGNOSIS — F17290 Nicotine dependence, other tobacco product, uncomplicated: Secondary | ICD-10-CM | POA: Insufficient documentation

## 2018-08-16 DIAGNOSIS — J019 Acute sinusitis, unspecified: Secondary | ICD-10-CM | POA: Insufficient documentation

## 2018-08-16 MED ORDER — FLUTICASONE PROPIONATE 50 MCG/ACT NA SUSP: 2.0000 | Freq: Every day | NASAL | 0 refills | Status: AC

## 2018-08-16 MED ORDER — CETIRIZINE HCL 10 MG PO TABS: 10.0000 mg | ORAL_TABLET | Freq: Every day | ORAL | 1 refills | Status: AC

## 2018-08-16 NOTE — Discharge Instructions (Signed)
We recommend Flonase for congestion as well as daily Zyrtec or Claritin.  You may use over-the-counter medications for cough.  Continue with sinus rinses.  Take 600 mg ibuprofen every 6 hours for any pain complaints.  Follow-up with a primary care doctor to ensure resolution of symptoms.

## 2018-08-16 NOTE — ED Triage Notes (Signed)
Pt states "I have a sinus infection. Congestion, ear aching and hard to breath." Pt denies OTC except for aspirin.

## 2018-08-16 NOTE — ED Provider Notes (Signed)
[facility]-EMERGENCY DEPT Provider Note   CSN: [id number] Arrival date & time: 08/16/18  0026     History   Chief Complaint Chief Complaint  Patient presents with  . Cough    HPI Antonio Austin is a 22 y.o. male.   22 year old male presents to the emergency department for symptoms consistent with sinusitis.  He reports that he has been experiencing nasal congestion as well as postnasal drip, sore throat, cough for the past 2 days.  Has been experiencing aching in his ears.  Notes some difficulty breathing through his nose.  Has been using a Nettie pot as well as aspirin for symptomatic management.  Reports no relief with these medicines.  He has not had any fever, vomiting, diarrhea, inability to swallow, drooling.  The history is provided by the patient. No language interpreter was used.  Cough     History reviewed. No pertinent past medical history.  There are no active problems to display for this patient.   Past Surgical History:  Procedure Laterality Date  . WISDOM TOOTH EXTRACTION        Home Medications    Prior to Admission medications   Medication Sig Start Date End Date Taking? Authorizing Provider  carbamide peroxide (DEBROX) 6.5 % OTIC solution Place 5 drops into both ears 2 (two) times daily. 09/23/17   Fayrene Helperran, [place], PA-C  cetirizine (ZYRTEC ALLERGY) 10 MG tablet Take 1 tablet (10 mg total) by mouth daily. 08/16/18   Antony MaduraHumes, Nakshatra Klose, PA-C  fluticasone (FLONASE) 50 MCG/ACT nasal spray Place 2 sprays into both nostrils daily. 08/16/18   Antony MaduraHumes, Ludene Stokke, PA-C  ibuprofen (ADVIL,MOTRIN) 600 MG tablet Take 1 tablet (600 mg total) by mouth every 6 (six) hours as needed. 06/14/17   Aviva KluverMurray, Alyssa B, PA-C  naproxen (NAPROSYN) 500 MG tablet Take 1 tablet (500 mg total) by mouth 2 (two) times daily. 06/19/17   Kellie ShropshireShrosbree, Emily J, PA-C    Family History History reviewed. No pertinent family history.  Social History Social History   Tobacco Use  . Smoking  status: Current Some Day Smoker    Types: Cigars  . Smokeless tobacco: Never Used  Substance Use Topics  . Alcohol use: Not Currently    Comment: occ  . Drug use: No     Allergies   Bee venom   Review of Systems Review of Systems  Respiratory: Positive for cough.   Ten systems reviewed and are negative for acute change, except as noted in the HPI.    Physical Exam Updated Vital Signs BP 110/73 (BP Location: Left Arm)   Pulse 78   Temp 99.2 F (37.3 C) (Oral)   Resp 16   Ht 5\' 6"  (1.676 m)   Wt 59 kg   SpO2 97%   BMI 20.98 kg/m   Physical Exam  Constitutional: He is oriented to person, place, and time. He appears well-developed and well-nourished. No distress.  Nontoxic appearing and in NAD  HENT:  Head: Normocephalic and atraumatic.  Right Ear: Tympanic membrane, external ear and ear canal normal.  Left Ear: External ear and ear canal normal.  Nose: Right sinus exhibits no maxillary sinus tenderness and no frontal sinus tenderness. Left sinus exhibits no maxillary sinus tenderness and no frontal sinus tenderness.  Mouth/Throat: Uvula is midline and oropharynx is clear and moist. No posterior oropharyngeal edema or posterior oropharyngeal erythema. No tonsillar exudate.  Left TM obscured by cerumen  Eyes: Conjunctivae and EOM are normal. No scleral icterus.  Neck:  Normal range of motion.  No meningismus  Cardiovascular: Normal rate, regular rhythm and intact distal pulses.  Pulmonary/Chest: Effort normal. No stridor. No respiratory distress. He has no wheezes.  Respirations even and unlabored. Lungs CTAB.  Musculoskeletal: Normal range of motion.  Neurological: He is alert and oriented to person, place, and time. He exhibits normal muscle tone. Coordination normal.  Skin: Skin is warm and dry. No rash noted. He is not diaphoretic. No erythema. No pallor.  Psychiatric: He has a normal mood and affect. His behavior is normal.  Nursing note and vitals  reviewed.    ED Treatments / Results  Labs (all labs ordered are listed, but only abnormal results are displayed) Labs Reviewed - No data to display  EKG None  Radiology No results found.  Procedures Procedures (including critical care time)  Medications Ordered in ED Medications - No data to display   Initial Impression / Assessment and Plan / ED Course  I have reviewed the triage vital signs and the nursing notes.  Pertinent labs & imaging results that were available during my care of the patient were reviewed by me and considered in my medical decision making (see chart for details).     Patient complaining of symptoms of sinusitis.  Mild to moderate symptoms of clear/yellow nasal discharge/congestion and scratchy throat with cough for less than 10 days.  Patient is afebrile.  No concern for acute bacterial rhinosinusitis; likely viral in nature.  Patient discharged with symptomatic treatment.  Patient instructions given for warm saline nasal washes.  Recommendations for follow-up with primary care physician.  Return precautions discussed and provided. Patient discharged in stable condition with no unaddressed concerns.   Final Clinical Impressions(s) / ED Diagnoses   Final diagnoses:  Acute non-recurrent sinusitis, unspecified location    ED Discharge Orders         Ordered    fluticasone (FLONASE) 50 MCG/ACT nasal spray  Daily     08/16/18 0115    cetirizine (ZYRTEC ALLERGY) 10 MG tablet  Daily     08/16/18 0115           Antony Madura, PA-C 08/16/18 0124    Derwood Kaplan, MD 08/17/18 [phone]
